# Patient Record
Sex: Male | Born: 1995 | Race: White | Hispanic: No | Marital: Single | Smoking: Never smoker
Health system: Southern US, Community
[De-identification: ages and names within clinical notes are randomized; demographics above are authoritative.]

## PROBLEM LIST (undated history)

## (undated) DIAGNOSIS — L309 Dermatitis, unspecified: Secondary | ICD-10-CM

## (undated) DIAGNOSIS — T7840XA Allergy, unspecified, initial encounter: Secondary | ICD-10-CM

## (undated) DIAGNOSIS — F909 Attention-deficit hyperactivity disorder, unspecified type: Secondary | ICD-10-CM

## (undated) HISTORY — DX: Attention-deficit hyperactivity disorder, unspecified type: F90.9

## (undated) HISTORY — DX: Dermatitis, unspecified: L30.9

## (undated) HISTORY — PX: NASAL FRACTURE SURGERY: SHX718

## (undated) HISTORY — PX: HYPOSPADIAS CORRECTION: SHX483

## (undated) HISTORY — DX: Allergy, unspecified, initial encounter: T78.40XA

---
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
## 2017-02-02 ENCOUNTER — Other Ambulatory Visit: Payer: Self-pay | Admitting: Family Medicine

## 2017-02-02 MED ORDER — LISDEXAMFETAMINE DIMESYLATE 50 MG PO CAPS: 50.0000 mg | ORAL_CAPSULE | ORAL | 0 refills | Status: DC

## 2017-02-02 NOTE — Telephone Encounter (Signed)
Pt request rx vyvanse. Call when ready for pick up. Last seen 09/11/16 for annual and last printed # 90 on 10/21/16.

## 2017-02-02 NOTE — Telephone Encounter (Signed)
Patient notified by telephone that script is up front ready for pickup. 

## 2017-02-02 NOTE — Telephone Encounter (Signed)
Printed.  Thanks.  

## 2017-02-02 NOTE — Telephone Encounter (Signed)
Copied from CRM [phone]. Topic: Quick Communication - See Telephone Encounter >> Feb 02, 2017 10:20 AM Diana EvesHoyt, Maryann B wrote: CRM for notification. See Telephone encounter for:  Refill on vyvanse  02/02/17.

## 2017-05-09 ENCOUNTER — Other Ambulatory Visit: Payer: Self-pay | Admitting: Family Medicine

## 2017-05-09 NOTE — Telephone Encounter (Signed)
Copied from CRM [phone]. Topic: Quick Communication - Rx Refill/Question >> May 09, 2017  1:13 PM Oneal GroutSebastian, Jennifer S wrote: Medication: lisdexamfetamine (VYVANSE) 50 MG capsule, 90 day supply  Has the patient contacted their pharmacy? Yes.   (Agent: If no, request that the patient contact the pharmacy for the refill.) Preferred Pharmacy (with phone number or street name): [facility] [address] Agent: Please be advised that RX refills may take up to 3 business days. We ask that you follow-up with your pharmacy.

## 2017-05-10 NOTE — Telephone Encounter (Signed)
Vyvanse refill request - controlled substance  LOV 08/16/16 with Dr. [facility]can  [facility]  [address]

## 2017-05-10 NOTE — Telephone Encounter (Signed)
[facility]ing [facility] [address] [place]; Last filled # 90 on 02/02/17 Last annual 08/16/16.Please advise.

## 2017-05-11 MED ORDER — LISDEXAMFETAMINE DIMESYLATE 50 MG PO CAPS: 50.0000 mg | ORAL_CAPSULE | ORAL | 0 refills | Status: DC

## 2017-05-11 NOTE — Telephone Encounter (Signed)
Sent.  Thanks. Needs f/u this summer.

## 2017-05-11 NOTE — Telephone Encounter (Signed)
Please schedule appointment as instructed. 

## 2017-05-13 NOTE — Telephone Encounter (Signed)
appt made with patient for Austin Kelly, RMA

## 2017-08-05 ENCOUNTER — Encounter: Payer: Self-pay | Admitting: Family Medicine

## 2017-08-05 ENCOUNTER — Ambulatory Visit: Payer: 59 | Admitting: Family Medicine

## 2017-08-05 VITALS — BP 132/74 | HR 97 | Temp 97.9°F | Ht 68.0 in | Wt 135.8 lb

## 2017-08-05 DIAGNOSIS — J309 Allergic rhinitis, unspecified: Secondary | ICD-10-CM

## 2017-08-05 DIAGNOSIS — Z7189 Other specified counseling: Secondary | ICD-10-CM

## 2017-08-05 DIAGNOSIS — F909 Attention-deficit hyperactivity disorder, unspecified type: Secondary | ICD-10-CM | POA: Diagnosis not present

## 2017-08-05 MED ORDER — GUANFACINE HCL ER 2 MG PO TB24: 2.0000 mg | ORAL_TABLET | Freq: Every day | ORAL | 3 refills | Status: DC

## 2017-08-05 MED ORDER — LISDEXAMFETAMINE DIMESYLATE 50 MG PO CAPS: 50.0000 mg | ORAL_CAPSULE | ORAL | 0 refills | Status: DC

## 2017-08-05 MED ORDER — DEXTROAMPHETAMINE SULFATE 5 MG PO TABS: 5.0000 mg | ORAL_TABLET | Freq: Every day | ORAL | 0 refills | Status: DC

## 2017-08-05 MED ORDER — BUDESONIDE 32 MCG/ACT NA SUSP: NASAL | 12 refills | Status: DC

## 2017-08-05 NOTE — Progress Notes (Signed)
Entering second year at [employer].  As and Bs at school.  He enjoyed Health visitorphysics and 3D computer modeling.  Home now and working this summer, taking some online classes.  He is registered for the fall.  Will live in dorm again.  Plan for mechanical engineering.    Parents designated if patient were incapacitated.  D/w pt.    ADD Compliant with meds: yes benefit from med (ie increase in concentration): yes  change in mood: no change in appetite: no Insomnia: no Tremor: no compliant with behavioral modification: yes.   ROS: Per HPI unless specifically indicated in ROS section   Meds, vitals, and allergies reviewed.   GEN: nad, alert and oriented, affect wnl and appropriate HEENT: mucous membranes moist NECK: supple w/o LA CV: rrr.  PULM: ctab, no inc wob ABD: soft, +bs EXT: no edema CN 2-12 wnl, s/s/dtr wnl x4.  No tremor.

## 2017-08-05 NOTE — Patient Instructions (Signed)
I would get a flu shot each fall.   Don't change your meds for now.  Update me as needed.  Take care.  Glad to see you.  

## 2017-08-07 ENCOUNTER — Other Ambulatory Visit: Payer: Self-pay | Admitting: Family Medicine

## 2017-08-07 DIAGNOSIS — Z7189 Other specified counseling: Secondary | ICD-10-CM | POA: Insufficient documentation

## 2017-08-07 NOTE — Assessment & Plan Note (Signed)
Not mentioned above, but doing well with meds, continue as is.  Allergy sx controlled w/o ADE on med.

## 2017-08-07 NOTE — Assessment & Plan Note (Addendum)
Continue as is, no ADE on meds.  Doing well, with relief from meds.  Update me as needed.  Routine cautions given re: use/potential misuse of meds.  He understood.

## 2017-08-07 NOTE — Assessment & Plan Note (Signed)
Parents designated if patient were incapacitated.   

## 2017-09-23 ENCOUNTER — Other Ambulatory Visit: Payer: Self-pay | Admitting: Family Medicine

## 2017-09-23 NOTE — Telephone Encounter (Signed)
Vyvanse 50 mg refill Last Refill:08/05/17 # 90 Last OV: 08/05/17 PCP: Para Marchuncan [id number] [place]

## 2017-09-23 NOTE — Telephone Encounter (Signed)
Patient's mother calling to check on this refill. Informed her that we are waiting on approval from Dr Para Marchuncan. States that it's the pharmacies fault because they only filled it for 30 days and then told them that they could fill the other 60 pills and went back and said they have to have Dr approval. Patient's mother upset and wanted PEC to call Dr Para Marchuncan on his personal number to get the medication. Informed her that I can not do that for this issue. Advised that since the request was not sent in until today, Dr Para Marchuncan has 48 to 72 hours to get it sent to the pharmacy.

## 2017-09-23 NOTE — Telephone Encounter (Signed)
Copied from CRM #[id number]. Topic: Quick Communication - Rx Refill/Question °>> Sep 23, 2017 12:26 PM [name], [name] wrote: °Medication: lisdexamfetamine (VYVANSE) 50 MG capsule [[id number]]  ° °Has the patient contacted their pharmacy? Yes Pharmacy only filled 30 day supply on 08/13/17 in need of new script. ° ° °Preferred Pharmacy (with phone number or street name): ° °Agent: Please be advised that RX refills may take up to 3 business days. We ask that you follow-up with your pharmacy. °

## 2017-09-23 NOTE — Telephone Encounter (Signed)
Name of Medication: Vyvanse 50 mg Name of [facility]acy: [facility] Last Fill or Written Date and Quantity: # 90 on 08/05/17 Last Office Visit and Type: 08/05/17 FU for Vyvanse Next Office Visit and Type: none Last Controlled Substance Agreement Date:07/29/16  Last UDS:07/30/16

## 2017-09-25 NOTE — Telephone Encounter (Signed)
This is the first I had heard of this situation.   Agree with needing time to safely address the refill.  We don't refill controlled meds after hours, so had I been called after 5PM I wouldn't have filled it per protocol.   In this case, I need verification from the pharmacy about the number filled on vyvanse, since rx was written for #90 on 08/05/17.  I need to know if they'll fill 30 vs 90 day supply (or if they will hold 3 postdated rxs, each for a 30 day supply) prior to addressing this rx.  Let me know and I'll work on it when possible.  Thanks.

## 2017-09-26 ENCOUNTER — Telehealth: Payer: Self-pay | Admitting: Family Medicine

## 2017-09-26 MED ORDER — LISDEXAMFETAMINE DIMESYLATE 50 MG PO CAPS: 50.0000 mg | ORAL_CAPSULE | Freq: Every day | ORAL | 0 refills | Status: DC

## 2017-09-26 NOTE — Telephone Encounter (Signed)
Copied from CRM [phone]#[id number]. Topic: Quick Communication - Rx Refill/Question >> Sep 23, 2017 12:26 PM Jens SomMedley, [name] wrote: Medication: lisdexamfetamine (VYVANSE) 50 MG capsule [[id number]][[id number]]   Has the patient contacted their pharmacy? Yes Pharmacy only filled 30 day supply on 08/13/17 in need of new script.   Preferred Pharmacy (with phone number or street name):  Agent: Please be advised that RX refills may take up to 3 business days. We ask that you follow-up with your pharmacy.

## 2017-09-26 NOTE — Telephone Encounter (Signed)
Please see 09/23/17 refill request.

## 2017-09-26 NOTE — Telephone Encounter (Signed)
Spoke with pharmacist who says #30 was filled on 08/05/17 and they cannot honor refills because it is a C2 Rx.    3 post-dated prescriptions can be sent electronically and held but no paper prescriptions can be held in the pharmacy.  The patient can bring the Rx in as needed.

## 2017-09-26 NOTE — Telephone Encounter (Signed)
rx sent x3. Thanks.

## 2017-10-24 ENCOUNTER — Telehealth: Payer: Self-pay

## 2017-10-24 ENCOUNTER — Telehealth: Payer: Self-pay | Admitting: Family Medicine

## 2017-10-24 MED ORDER — LISDEXAMFETAMINE DIMESYLATE 50 MG PO CAPS: 50.0000 mg | ORAL_CAPSULE | Freq: Every day | ORAL | 0 refills | Status: DC

## 2017-10-24 NOTE — Telephone Encounter (Signed)
Sent. Thanks.  Please d/c the local rxs that remain.

## 2017-10-24 NOTE — Telephone Encounter (Signed)
According to med list pt has Vyvanse 50 mg # 30 x 2 on 09/26/17.Please advise.

## 2017-10-24 NOTE — Telephone Encounter (Signed)
Copied from CRM [id number]. Topic: Quick Communication - See Telephone Encounter >> Oct 24, 2017  4:58 PM Floria Raveling A wrote: CRM for notification. See Telephone encounter for: 10/24/17. Pt called in and stated he is no in [place] and he is not able to pick up this med here. lisdexamfetamine (VYVANSE) 50 MG capsule [[id number]].  He would like to know if it can be sent to a pharmacy in [place]?    [facility] [address]  [phone]  Pt best number  - [phone]

## 2017-10-24 NOTE — Telephone Encounter (Signed)
Patient seen at [facility]

## 2017-10-24 NOTE — Telephone Encounter (Signed)
PLEASE NOTE: All timestamps contained within this report are represented as [place] Standard Time. CONFIDENTIALTY NOTICE: This fax transmission is intended only for the addressee. It contains information that is legally privileged, confidential or otherwise protected from use or disclosure. If you are not the intended recipient, you are strictly prohibited from reviewing, disclosing, copying using or disseminating any of this information or taking any action in reliance on or regarding this information. If you have received this fax in error, please notify us immediately by telephone so that we can arrange for its return to [place]. Phone: [phone], Toll-Free: [phone], Fax: [phone] Page: 1 of 2 Call Id: [id number] [facility] Night - Client TELEPHONE ADVICE RECORD [facility] Patient Name: Austin Kelly Gender: Unknown DOB: 1995/03/23 Age: 22 Y 8 M 21 D Return Phone Number: [phone] (Primary) Address: City/State/Zip: [place] [address] Client [facility] Night - Client Client Site [facility] - Night Physician Raechel Ache - MD Contact Type Call Who Is Calling Patient / Member / Family / Caregiver Call Type Triage / Clinical Relationship To Patient Self Return Phone Number [phone] (Primary) Chief Complaint Prescription Refill or Medication Request (non symptomatic) Reason for Call Medication Question / Request Initial Comment Caller had a 90 day prescription which became a 30 day with two refills. He is at college now and he is unable to fill it. Translation No Nurse Assessment Nurse: Hart Rochester, RN, Darl Pikes Date/Time [name] Time): 10/23/2017 12:48:38 PM Please select the assessment type ---Request for controlled medication refill Additional Documentation ---Caller requests 90 day fill for Vyvanse since he is at college Is there an on-call physician for the client? ---Yes Do the  client directives specifically allow for paging the on-call regarding scheduled drugs? ---No Additional Documentation ---Caller instructed to call the office in the morning for questions regarding his refill. Verbalized understanding. Nurse: Hart Rochester, RN, Darl Pikes Date/Time [name] Time): 10/23/2017 12:46:03 PM Confirm and document reason for call. If symptomatic, describe symptoms. ---Caller reports that he was supposed to have a 90 day prescription refill, but ended up with a 30 day prescription with 2 refills. Prescription is for Vyvanse 10 mg. Is not out of medication. Denies assessment. [facility] [address] [place] [phone] Does the patient have any new or worsening symptoms? ---No Guidelines Guideline Title Affirmed Question Affirmed Notes Nurse Date/Time (Eastern Time) Disp. Time [name] Time) Disposition Final User 10/23/2017 12:49:48 PM Clinical Call Yes Hart Rochester, RN, Estill Bamberg NOTE: All timestamps contained within this report are represented as [place] Standard Time. CONFIDENTIALTY NOTICE: This fax transmission is intended only for the addressee. It contains information that is legally privileged, confidential or otherwise protected from use or disclosure. If you are not the intended recipient, you are strictly prohibited from reviewing, disclosing, copying using or disseminating any of this information or taking any action in reliance on or regarding this information. If you have received this fax in error, please notify us immediately by telephone so that we can arrange for its return to [place]. Phone: [phone], Toll-Free: [phone], Fax: [phone] Page: 2 of 2 Call Id: [id number]

## 2017-10-24 NOTE — Telephone Encounter (Signed)
It was prev sent to the pharmacy locally.  Why can't he fill it?   If this is an issue re: pharmacy location, then he'll need to plan ahead about where to send med for refills.   If he has valid rx locally (and he should), then I whould use that for now.

## 2017-10-24 NOTE — Telephone Encounter (Signed)
Patient says he is at [facility] and cannot get here to pick up the refill and the pharmacy will not transfer the Rx.  Patient [facility] that [facility] [place]  [phone].

## 2017-10-25 NOTE — Telephone Encounter (Signed)
Pharmacy contacted to DC any remaining refills of Vyvanse.

## 2017-10-25 NOTE — Telephone Encounter (Signed)
Rx was sent to [facility] in [place] as requested.

## 2017-10-25 NOTE — Telephone Encounter (Signed)
See phone note 10/24/17.

## 2017-10-30 DIAGNOSIS — Z23 Encounter for immunization: Secondary | ICD-10-CM | POA: Diagnosis not present

## 2017-11-24 ENCOUNTER — Other Ambulatory Visit: Payer: Self-pay | Admitting: Family Medicine

## 2017-11-24 NOTE — Telephone Encounter (Signed)
Name of Medication: Vyvanse 50 mg Name of Pharmacy[facility] Last [facility] or Written Date and Quantity: # 30 on 10/24/17 Last Office Visit and Type: 08/05/17 for ADHD Next Office Visit and Type: none scheduled Last Controlled Substance Agreement Date: 07/29/16 Last UDS:07/30/16.

## 2017-11-25 NOTE — Telephone Encounter (Signed)
Pt mother called to request Vyvanse today - advised that no one is at the office. She states that pt runs out 11/27/17. She was upset bc mychart msg was sent 10/10 and hasn't even been viewed. Advised her of 3 business days to process. She said it used to be 24 hrs, then changed to 48 hrs. She noted this is not the first time pt has run out.   Scheduled appt for 02/07/18 at 12:00pm as pt will be home from college then. Can 3 months of Vyvanse be sent in to   [id number], [place] - [address] [phone] (Phone) [phone] (Fax)   Ok to respond to pt on his cell # or via mychart.

## 2017-11-27 MED ORDER — LISDEXAMFETAMINE DIMESYLATE 50 MG PO CAPS: 50.0000 mg | ORAL_CAPSULE | Freq: Every day | ORAL | 0 refills | Status: DC

## 2017-11-27 NOTE — Telephone Encounter (Signed)
The patient is above 20 years old so the communication needs to be directed back to him.  He needs to manage his supply of medication.  If he is taking it every day then he will know when he will run out and he needs to give [place] adequate time for refill requests.  We have previously compensated for his situation at college by sending the prescription to a second pharmacy.  We will continue to refill prescriptions as quickly as possible, within our protocol.  This isn't a delayed response.  His message had been reviewed but a nonemergent refill request like this can be filled within 3 business days.  rx sent for 30 days x3 rx.  He needs to check with the pharmacy to see if they'll hold/fill the 2nd and 3rd rx.  If he finds any of this unacceptable, then he needs to find a different clinic.  Either way I wish him the best.

## 2017-11-28 NOTE — Telephone Encounter (Signed)
Left detailed message on voicemail of patient. 

## 2017-11-29 ENCOUNTER — Telehealth: Payer: Self-pay | Admitting: *Deleted

## 2017-11-29 NOTE — Telephone Encounter (Signed)
PA submitted thru [facility] for Vyvanse, awaiting response.

## 2018-01-26 ENCOUNTER — Other Ambulatory Visit: Payer: Self-pay | Admitting: Family Medicine

## 2018-01-27 NOTE — Telephone Encounter (Signed)
Electronic refill request. Vyvanse Last office visit:   08/05/17 Last Filled:    30 capsule 0 11/27/2017  Please advise.

## 2018-01-29 NOTE — Telephone Encounter (Signed)
According to EMR, this was already send to [id number] [place] [place] - [address] back on 11/27/17 to fill on/after 01/26/18.  He needs to contact the pharmacy.

## 2018-01-30 ENCOUNTER — Encounter: Payer: Self-pay | Admitting: *Deleted

## 2018-01-30 NOTE — Telephone Encounter (Signed)
Notification sent through MyChart to patient.

## 2018-02-07 ENCOUNTER — Encounter: Payer: Self-pay | Admitting: Family Medicine

## 2018-02-07 ENCOUNTER — Ambulatory Visit (INDEPENDENT_AMBULATORY_CARE_PROVIDER_SITE_OTHER): Payer: 59 | Admitting: Family Medicine

## 2018-02-07 DIAGNOSIS — F909 Attention-deficit hyperactivity disorder, unspecified type: Secondary | ICD-10-CM

## 2018-02-07 MED ORDER — DEXTROAMPHETAMINE SULFATE 5 MG PO TABS: 5.0000 mg | ORAL_TABLET | Freq: Every day | ORAL | 0 refills | Status: DC

## 2018-02-07 MED ORDER — LISDEXAMFETAMINE DIMESYLATE 50 MG PO CAPS: 50.0000 mg | ORAL_CAPSULE | Freq: Every day | ORAL | 0 refills | Status: DC

## 2018-02-07 MED ORDER — LORATADINE 10 MG PO TABS: 10.0000 mg | ORAL_TABLET | Freq: Every day | ORAL | Status: DC | PRN

## 2018-02-07 MED ORDER — GUANFACINE HCL ER 2 MG PO TB24: 2.0000 mg | ORAL_TABLET | Freq: Every day | ORAL | 3 refills | Status: DC

## 2018-02-07 MED ORDER — BUDESONIDE 32 MCG/ACT NA SUSP: NASAL | 12 refills | Status: DC

## 2018-02-07 NOTE — Assessment & Plan Note (Signed)
Doing well.   We talked about refills.  He is going to get all of his meds at [facility].  I asked for adequate lead time to process refill requests.   rxs sent today.  Okay for outpatient f/u.  D/w pt about med use, routine cautions, exercise, etc.  Update me as needed.  He had a flu shot out of clinic.

## 2018-02-07 NOTE — Progress Notes (Signed)
ADD f/u.  As and Bs at [employer] this fall.  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    Will be taking racquetball, thermodynamics, differential math in the spring.    Living on campus.  Good roommates.  No ADE on meds.  No tremor, no insomnia.  Med still help.  He can tell a difference when he forgets to take med, ie he notes relief on med.    He is active on campus.  He is applying for internships.    We talked about refills.  He is going to get all of his meds at [facility].  I asked for adequate lead time to process refill requests.    Meds, vitals, and allergies reviewed.   ROS: Per HPI unless specifically indicated in ROS section   GEN: nad, alert and oriented HEENT: mucous membranes moist NECK: supple w/o LA CV: rrr PULM: ctab, no inc wob ABD: soft, +bs EXT: no edema SKIN: no acute rash CN 2-12 wnl B, S/S/DTR wnl x4

## 2018-02-07 NOTE — Patient Instructions (Signed)
Don't change your meds for now.  Take care.  Glad to see you.  Update me as needed.  Good luck with school.

## 2018-02-09 ENCOUNTER — Telehealth: Payer: Self-pay

## 2018-02-09 MED ORDER — DEXTROAMPHETAMINE SULFATE 5 MG PO TABS: 5.0000 mg | ORAL_TABLET | Freq: Every day | ORAL | 0 refills | Status: DC

## 2018-02-09 NOTE — Telephone Encounter (Signed)
Spoke with pt notifying him [facility]ill [facility].  Pt verbalizes understanding and expresses his thanks.

## 2018-02-09 NOTE — Telephone Encounter (Signed)
Sent in.plz notify pt.  

## 2018-02-09 NOTE — Telephone Encounter (Signed)
Austin Kelly with [facility] said they cannot get the dextroamphetamine 5 mg and they are dc the rx and pt request to send dextroamphetamine 5 mg to [facility]. Dextroamphetamine 5 mg # 90 with instructions take one tab po daily sent on 02/07/18. Dr Para Marchuncan is out of office.Please advise.

## 2018-05-29 ENCOUNTER — Other Ambulatory Visit: Payer: Self-pay | Admitting: Family Medicine

## 2018-05-29 NOTE — Telephone Encounter (Signed)
Name of Medication: lisdexamfetamine (VYVANSE) 50 MG capsule  Name of Pharmacy: [facility]  Last Fill or Written Date and Quantity: 02/07/2018 #30  Last Office Visit and Type: 02/07/2018 follow up  Next Office Visit and Type: 6/61/2020 CPE  Last Controlled Substance Agreement Date: 07/29/2016  Last UDS:07/30/2016

## 2018-05-30 MED ORDER — LISDEXAMFETAMINE DIMESYLATE 50 MG PO CAPS: 50.0000 mg | ORAL_CAPSULE | Freq: Every day | ORAL | 0 refills | Status: DC

## 2018-05-30 NOTE — Telephone Encounter (Signed)
Sent. Thanks.   

## 2018-08-01 ENCOUNTER — Encounter: Payer: Self-pay | Admitting: Family Medicine

## 2018-08-01 ENCOUNTER — Other Ambulatory Visit: Payer: Self-pay

## 2018-08-01 ENCOUNTER — Ambulatory Visit (INDEPENDENT_AMBULATORY_CARE_PROVIDER_SITE_OTHER): Payer: 59 | Admitting: Family Medicine

## 2018-08-01 VITALS — BP 138/80 | HR 91 | Temp 98.1°F | Ht 68.0 in | Wt 135.2 lb

## 2018-08-01 DIAGNOSIS — J309 Allergic rhinitis, unspecified: Secondary | ICD-10-CM

## 2018-08-01 DIAGNOSIS — F909 Attention-deficit hyperactivity disorder, unspecified type: Secondary | ICD-10-CM

## 2018-08-01 DIAGNOSIS — Z7189 Other specified counseling: Secondary | ICD-10-CM

## 2018-08-01 DIAGNOSIS — Z0000 Encounter for general adult medical examination without abnormal findings: Secondary | ICD-10-CM

## 2018-08-01 MED ORDER — LISDEXAMFETAMINE DIMESYLATE 50 MG PO CAPS: 50.0000 mg | ORAL_CAPSULE | Freq: Every day | ORAL | 0 refills | Status: DC

## 2018-08-01 MED ORDER — DEXTROAMPHETAMINE SULFATE 5 MG PO TABS: 5.0000 mg | ORAL_TABLET | Freq: Every day | ORAL | 0 refills | Status: DC

## 2018-08-01 NOTE — Patient Instructions (Signed)
Good luck with school.  Update me as needed.  Flu shot in the fall.  Last meningitis shot today.  Take care.  Glad to see you.

## 2018-08-01 NOTE — Progress Notes (Signed)
CPE- See plan.  Routine anticipatory guidance given to patient.  See health maintenance.  The possibility exists that previously documented standard health maintenance information may have been brought forward from a previous encounter into this note.  If needed, that same information has been updated to reflect the current situation based on today's encounter.    Pandemic considerations d/w pt, esp re: school.  He'll likely have some online vs in person classes. He'll be in an apartment.  He is taking one online this summer, will start that soon.    Tetanus 2011 Flu due fall.   PNA and shingles not due.  Colon and prostate cancer screening not due.  Living will d/w pt.  Parents designated if patient were incapacitated. Diet and exercise d/w pt.  Doing well with both.    He has some nosebleeds recently.  Not regular use of rhinocort to cause that.  He had more allergies this years, with less allergy med use, the combination of which may have contributed.  Discussed with patient.  He will update me as needed.  ADD Compliant with meds:  Yes, used daily.   benefit from med (ie increase in concentration): yes, he can tell when he has a missed dose.  change in mood:no change in appetite:no Insomnia:no tremor:no compliant with behavioral modification: yes  PMH and SH reviewed  Meds, vitals, and allergies reviewed.   ROS: Per HPI.  Unless specifically indicated otherwise in HPI, the patient denies:  General: fever. Eyes: acute vision changes ENT: sore throat Cardiovascular: chest pain Respiratory: SOB GI: vomiting GU: dysuria Musculoskeletal: acute back pain Derm: acute rash Neuro: acute motor dysfunction Psych: worsening mood Endocrine: polydipsia Heme: bleeding Allergy: hayfever  GEN: nad, alert and oriented HEENT: ncat NECK: supple w/o LA CV: rrr. 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  This is an old finding.  He can update me if he notices changes.  He had not noted any changes. No tremor

## 2018-08-02 ENCOUNTER — Telehealth: Payer: Self-pay | Admitting: Family Medicine

## 2018-08-02 NOTE — Assessment & Plan Note (Signed)
Parents designated if patient were incapacitated.   

## 2018-08-02 NOTE — Assessment & Plan Note (Signed)
Benefits from medication.  No adverse effect.  Continue as is.  Doing well in school.  He will update me as needed.  Routine cautions given.  He agrees.

## 2018-08-02 NOTE — Assessment & Plan Note (Signed)
Tetanus 2011 Flu due fall.   PNA and shingles not due.  Colon and prostate cancer screening not due.  Living will d/w pt.  Parents designated if patient were incapacitated. Diet and exercise d/w pt.  Doing well with both.

## 2018-08-02 NOTE — Assessment & Plan Note (Signed)
He has some nosebleeds recently.  Not regular use of rhinocort to cause that.  He had more allergies this years, with less allergy med use, the combination of which may have contributed.  Discussed with patient.  He will update me as needed.

## 2018-08-02 NOTE — Telephone Encounter (Signed)
We did not have Bexsero vaccine at patient's recent office visit.  I think we have it in stock now.  Please verify that we have it in stock and set up a nurse visit for this to be done.  Please document this onto the physical or in a way that he does not get an extra charge, since it would have normally been done at the physical.  I told him we were out of the vaccine at the visit and we would get in touch with him.  Thanks.

## 2018-08-22 NOTE — Telephone Encounter (Signed)
Pt needed first available appt he is scheduled for 09/05/18 @ 8:30am

## 2018-08-30 ENCOUNTER — Encounter: Payer: Self-pay | Admitting: Family Medicine

## 2018-08-31 ENCOUNTER — Other Ambulatory Visit: Payer: Self-pay | Admitting: *Deleted

## 2018-08-31 NOTE — Telephone Encounter (Signed)
Patient states on a MyChart message that [facility] cannot [facility] since his insurance changed and that we would be receiving a request from [facility] and possibly a PA, so far, no PA.

## 2018-09-01 MED ORDER — DEXTROAMPHETAMINE SULFATE 5 MG PO TABS: 5.0000 mg | ORAL_TABLET | Freq: Every day | ORAL | 0 refills | Status: DC

## 2018-09-01 NOTE — Telephone Encounter (Signed)
I sent this in the meantime.  Thanks.  I will await the PA, if needed.

## 2018-09-05 ENCOUNTER — Encounter: Payer: Self-pay | Admitting: Family Medicine

## 2018-09-05 ENCOUNTER — Other Ambulatory Visit: Payer: Self-pay

## 2018-09-05 ENCOUNTER — Ambulatory Visit (INDEPENDENT_AMBULATORY_CARE_PROVIDER_SITE_OTHER): Payer: 59 | Admitting: *Deleted

## 2018-09-05 DIAGNOSIS — Z23 Encounter for immunization: Secondary | ICD-10-CM

## 2018-09-05 NOTE — Telephone Encounter (Signed)
No PA was received from the insurance company but patient came in for a nurse visit for an injection today and asked if the PA had been completed.  PA done thru [facility], awaiting response.

## 2018-10-30 ENCOUNTER — Encounter: Payer: Self-pay | Admitting: Family Medicine

## 2018-11-01 ENCOUNTER — Telehealth: Payer: Self-pay | Admitting: Family Medicine

## 2018-11-01 NOTE — Telephone Encounter (Signed)
Please call pt.  See message below.  See if we can get a phone follow up arranged if he can't come in.  Thanks.   Dr. Damita Dunnings,   I have one refill remaining for my Vyvanse 50mg  prescription remaining. I have about a week of that medication remaining. That prescription was sent to [facility] (([phone]). I would like to discuss possily increasing my dose to 60mg . I feel that the 50mg  dose is not as effective as it used to be. I dont know if this would simply involve you rewriting that prescription to [place], or if I would need to conduct a visit to discuss this.   Thank you, Austin Kelly

## 2018-11-02 NOTE — Telephone Encounter (Signed)
Pt called about changing dosage of vyvanse and pt scheduled in office appt with Dr Damita Dunnings on 11/03/18 at 9:30.Pt has no covid symptoms, no travel and no known exposure to + covid.

## 2018-11-03 ENCOUNTER — Ambulatory Visit (INDEPENDENT_AMBULATORY_CARE_PROVIDER_SITE_OTHER): Payer: 59 | Admitting: Family Medicine

## 2018-11-03 ENCOUNTER — Encounter: Payer: Self-pay | Admitting: Family Medicine

## 2018-11-03 ENCOUNTER — Other Ambulatory Visit: Payer: Self-pay

## 2018-11-03 VITALS — BP 148/72 | HR 109 | Temp 98.3°F | Ht 68.0 in | Wt 141.2 lb

## 2018-11-03 DIAGNOSIS — Z23 Encounter for immunization: Secondary | ICD-10-CM | POA: Diagnosis not present

## 2018-11-03 DIAGNOSIS — F909 Attention-deficit hyperactivity disorder, unspecified type: Secondary | ICD-10-CM | POA: Diagnosis not present

## 2018-11-03 NOTE — Telephone Encounter (Signed)
Noted. Thanks.

## 2018-11-03 NOTE — Patient Instructions (Addendum)
Check your BP and pulse over the weekend at rest and update me Monday.  We'll go from there.  I need to see those readings prior to med changes.   Take care.  Glad to see you.

## 2018-11-03 NOTE — Progress Notes (Signed)
He is still enrolled at [employer], was living on campus, had to move home due to [place] pandemic.  All of his classes are online.  He is trying to adjust to the changes.  He is living at home.  He is away from his friends.    He had benefit from med prev with concentration.   He had a lot of coffee just prior to OV here today.   He typically does not have elevated blood pressure or tachycardia.  No chest pain.  Not short of breath. Flu shot done at office visit  Meds, vitals, and allergies reviewed.   ROS: Per HPI unless specifically indicated in ROS section   GEN: nad, alert and oriented HEENT: ncat NECK: supple w/o LA CV: Regular but tacky. no murmur PULM: ctab, no inc wob ABD: soft, +bs EXT: no edema  SKIN: no acute rash

## 2018-11-04 ENCOUNTER — Encounter: Payer: Self-pay | Admitting: Family Medicine

## 2018-11-05 NOTE — Assessment & Plan Note (Signed)
I want him to recheck his blood pressure and pulse out of clinic.  If those normalize then we can consider increasing his Vyvanse.  He has been on a stable dose for a long period of time and he has a significant disruption in school that likely makes it harder for him to complete his task, with a transition to online learning.  Discussed with patient.  He agrees.  He will update me.

## 2018-11-06 ENCOUNTER — Telehealth: Payer: Self-pay

## 2018-11-06 MED ORDER — LISDEXAMFETAMINE DIMESYLATE 60 MG PO CAPS: 60.0000 mg | ORAL_CAPSULE | Freq: Every day | ORAL | 0 refills | Status: DC

## 2018-11-06 NOTE — Telephone Encounter (Signed)
Pt left v/m; pt seen 11/03/18 and pt sent pt message on 11/04/18; pt request BP readings be reviewed in pt message and an increase in Vyvanse dosage;prefers o have rx sent to pharmacy today due to pt being out of med.pt request cb.

## 2018-11-06 NOTE — Telephone Encounter (Signed)
pts mom had left a ;v/m requesting cb with status of vyvanse rx. I called and spoke with pt and he saw the mychart note from Dr Damita Dunnings and nothing further needed.

## 2018-11-06 NOTE — Telephone Encounter (Signed)
I sent the refill with the dose adjusted.  I sent him a Pharmacist, community message.  Thanks.

## 2018-11-20 ENCOUNTER — Encounter: Payer: Self-pay | Admitting: Family Medicine

## 2018-11-21 ENCOUNTER — Other Ambulatory Visit: Payer: Self-pay | Admitting: Family Medicine

## 2018-11-21 MED ORDER — LISDEXAMFETAMINE DIMESYLATE 60 MG PO CAPS: 60.0000 mg | ORAL_CAPSULE | ORAL | 0 refills | Status: DC

## 2018-11-21 MED ORDER — LISDEXAMFETAMINE DIMESYLATE 60 MG PO CAPS: 60.0000 mg | ORAL_CAPSULE | Freq: Every day | ORAL | 0 refills | Status: DC

## 2019-01-09 ENCOUNTER — Other Ambulatory Visit: Payer: Self-pay

## 2019-01-09 NOTE — Telephone Encounter (Signed)
I received a refill request for patient's Dextroamphetamine. Patient was last seen on 11/03/18. This medication was last refilled 09/01/18 for #90 with 0 refills. Patient has no upcoming appts. Is this ok to refill?   Pt requests [facility] delivery pharmacy. Thanks!

## 2019-01-10 MED ORDER — DEXTROAMPHETAMINE SULFATE 5 MG PO TABS: 5.0000 mg | ORAL_TABLET | Freq: Every day | ORAL | 0 refills | Status: DC

## 2019-01-10 NOTE — Telephone Encounter (Signed)
Sent. Thanks.   

## 2019-02-17 ENCOUNTER — Encounter: Payer: Self-pay | Admitting: Family Medicine

## 2019-02-19 ENCOUNTER — Other Ambulatory Visit: Payer: Self-pay | Admitting: Family Medicine

## 2019-02-19 NOTE — Telephone Encounter (Signed)
Electronic refill request. Guanfacine Last office visit:   11/03/2018 Last Filled:    90 tablet 3 02/07/2018   Please advise.

## 2019-02-20 NOTE — Telephone Encounter (Signed)
See which pharmacy he wants to use with this and let me know. Thanks.

## 2019-02-20 NOTE — Telephone Encounter (Signed)
Patient doesn't go back to school until 03/03/2019 so send to [facility].

## 2019-02-20 NOTE — Telephone Encounter (Signed)
Sent. Thanks.   

## 2019-02-21 ENCOUNTER — Other Ambulatory Visit: Payer: Self-pay | Admitting: Family Medicine

## 2019-02-21 MED ORDER — LISDEXAMFETAMINE DIMESYLATE 60 MG PO CAPS: 60.0000 mg | ORAL_CAPSULE | Freq: Every day | ORAL | 0 refills | Status: DC

## 2019-02-21 MED ORDER — LISDEXAMFETAMINE DIMESYLATE 60 MG PO CAPS: 60.0000 mg | ORAL_CAPSULE | ORAL | 0 refills | Status: DC

## 2019-02-27 ENCOUNTER — Ambulatory Visit: Payer: 59 | Attending: Internal Medicine

## 2019-02-27 DIAGNOSIS — Z20822 Contact with and (suspected) exposure to covid-19: Secondary | ICD-10-CM

## 2019-03-01 LAB — NOVEL CORONAVIRUS, NAA: SARS-CoV-2, NAA: NOT DETECTED

## 2019-03-06 ENCOUNTER — Telehealth: Payer: Self-pay | Admitting: Family Medicine

## 2019-03-06 NOTE — Telephone Encounter (Signed)
Patient called today  He spoke with the pharmacy and they stated he will need a prior Authorization done for his  VYVANSE 60 mg   [facility] [address] [place]

## 2019-03-07 NOTE — Telephone Encounter (Signed)
PA approved for dates: 03/07/19-03/06/20. Pharmacy advised through fax. Patient advised.

## 2019-03-07 NOTE — Telephone Encounter (Signed)
Working on [place] for this, submitted through covermymeds. Awaiting response from insurance

## 2019-06-19 ENCOUNTER — Other Ambulatory Visit: Payer: Self-pay | Admitting: Family Medicine

## 2019-06-19 MED ORDER — LISDEXAMFETAMINE DIMESYLATE 60 MG PO CAPS: 60.0000 mg | ORAL_CAPSULE | ORAL | 0 refills | Status: DC

## 2019-06-19 MED ORDER — LISDEXAMFETAMINE DIMESYLATE 60 MG PO CAPS: 60.0000 mg | ORAL_CAPSULE | Freq: Every day | ORAL | 0 refills | Status: DC

## 2019-06-19 NOTE — Telephone Encounter (Signed)
Sent. Thanks.   

## 2019-06-19 NOTE — Telephone Encounter (Signed)
Name of Medication: Vyvanse [facility] or Written Date and Quantity: 02/21/19 x 3 RXs Last Office Visit and Type: 11/03/2018 to discuss Vyvanse. Next Office Visit and Type: none Last Controlled Substance Agreement Date: 07/30/16 Last UDS: 07/30/16

## 2019-07-19 ENCOUNTER — Telehealth: Payer: Self-pay | Admitting: Family Medicine

## 2019-07-19 NOTE — Telephone Encounter (Signed)
Patient's mother,Sandra,called. Patient scheduled appointment for a physical on 09/24/19.  Patient's going to be working in [place] for the summer.  Patient will run out of Vyvanse on 09/18/19. Dois Davenport wanted to make sure the medication can be refilled before patient's appointment on 09/24/19.  If it can be refilled before appointment, can the rx be sent to [place].Please call Dois Davenport back.

## 2019-07-20 NOTE — Telephone Encounter (Signed)
Mom Dois Davenport) advised.

## 2019-07-20 NOTE — Telephone Encounter (Signed)
We can take care of this.  Have him update [place] 1 week before the refill as needed and give [place] the desired pharmacy address at that time.  Thanks.

## 2019-08-22 ENCOUNTER — Other Ambulatory Visit: Payer: Self-pay | Admitting: Family Medicine

## 2019-08-27 NOTE — Telephone Encounter (Signed)
Refill request Dextrostat Last refill 01/10/19 #90 Last office visit 11/03/18 Upcoming appointment 09/24/18

## 2019-08-28 MED ORDER — DEXTROAMPHETAMINE SULFATE 5 MG PO TABS: 5.0000 mg | ORAL_TABLET | Freq: Every day | ORAL | 0 refills | Status: DC

## 2019-08-28 NOTE — Telephone Encounter (Signed)
Sent. Thanks.   

## 2019-09-20 ENCOUNTER — Encounter: Payer: Self-pay | Admitting: Family Medicine

## 2019-09-20 NOTE — Telephone Encounter (Signed)
Last office visit 11/03/2018 for Discuss Vyvanse.  Last refilled 06/19/2019 for #30 with no refills x 3 months.  Next Appt: 09/27/2019 for CPE.

## 2019-09-21 MED ORDER — LISDEXAMFETAMINE DIMESYLATE 60 MG PO CAPS: 60.0000 mg | ORAL_CAPSULE | ORAL | 0 refills | Status: DC

## 2019-09-21 NOTE — Telephone Encounter (Signed)
Sent. Thanks.   

## 2019-09-24 ENCOUNTER — Encounter: Payer: 59 | Admitting: Family Medicine

## 2019-09-27 ENCOUNTER — Encounter: Payer: Self-pay | Admitting: Family Medicine

## 2019-09-27 ENCOUNTER — Other Ambulatory Visit: Payer: Self-pay

## 2019-09-27 ENCOUNTER — Ambulatory Visit (INDEPENDENT_AMBULATORY_CARE_PROVIDER_SITE_OTHER): Payer: 59 | Admitting: Family Medicine

## 2019-09-27 VITALS — BP 138/82 | HR 76 | Temp 97.0°F | Ht 69.5 in | Wt 140.1 lb

## 2019-09-27 DIAGNOSIS — F909 Attention-deficit hyperactivity disorder, unspecified type: Secondary | ICD-10-CM

## 2019-09-27 DIAGNOSIS — Z0000 Encounter for general adult medical examination without abnormal findings: Secondary | ICD-10-CM

## 2019-09-27 DIAGNOSIS — Z23 Encounter for immunization: Secondary | ICD-10-CM

## 2019-09-27 DIAGNOSIS — Z7189 Other specified counseling: Secondary | ICD-10-CM

## 2019-09-27 MED ORDER — LISDEXAMFETAMINE DIMESYLATE 60 MG PO CAPS: 60.0000 mg | ORAL_CAPSULE | Freq: Every day | ORAL | 0 refills | Status: DC

## 2019-09-27 MED ORDER — LISDEXAMFETAMINE DIMESYLATE 60 MG PO CAPS: 60.0000 mg | ORAL_CAPSULE | ORAL | 0 refills | Status: DC

## 2019-09-27 NOTE — Progress Notes (Signed)
This visit occurred during the SARS-CoV-2 public health emergency.  Safety protocols were in place, including screening questions prior to the visit, additional usage of staff PPE, and extensive cleaning of exam room while observing appropriate contact time as indicated for disinfecting solutions.  Tetanus 2021 Flu due fall.   covid vaccine 2021 PNA and shingles not due.  Colon and prostate cancer screening not due.  Living will d/w pt.  Parents designated if patient were incapacitated. Diet and exercise d/w pt.  Doing well with both.    Starting senior year at [employer] this fall.    ADD Compliant with meds: yes benefit from med (ie increase in concentration): yes, he can tell when he is off med change in mood: no change in appetite:no Insomnia: no tremor:no compliant with behavioral modification: yes, making lists, etc.    [facility] [address] [place] [facility]    ROS: Per HPI unless specifically indicated in ROS section  Meds, vitals, and allergies reviewed.   GEN: nad, alert and oriented, affect wnl and appropriate HEENT: ncat NECK: supple w/o LA CV: rrr.  PULM: ctab, no inc wob ABD: soft, +bs EXT: no edema No tremor.

## 2019-09-27 NOTE — Patient Instructions (Signed)
Don't change your meds for now.   Take care.  Glad to see you. Update me as needed.   

## 2019-10-01 NOTE — Assessment & Plan Note (Signed)
Tetanus 2021 Flu due fall.   covid vaccine 2021 PNA and shingles not due.  Colon and prostate cancer screening not due.  Living will d/w pt.  Parents designated if patient were incapacitated. Diet and exercise d/w pt.  Doing well with both.

## 2019-10-01 NOTE — Assessment & Plan Note (Signed)
Controlled with meds.  Routine cautions given to patient.  No change in meds at this point.  He agrees with plan.

## 2019-10-01 NOTE — Assessment & Plan Note (Signed)
Living will d/w pt.  Parents designated if patient were incapacitated.  °

## 2019-12-25 ENCOUNTER — Other Ambulatory Visit: Payer: Self-pay | Admitting: Family Medicine

## 2019-12-25 NOTE — Telephone Encounter (Signed)
Last office visit 09/27/2019. No upcoming appt Last refill written 09/27/2019 (fill on/after 11/20/2019)

## 2019-12-26 MED ORDER — LISDEXAMFETAMINE DIMESYLATE 60 MG PO CAPS: 60.0000 mg | ORAL_CAPSULE | ORAL | 0 refills | Status: DC

## 2019-12-26 MED ORDER — LISDEXAMFETAMINE DIMESYLATE 60 MG PO CAPS
60.0000 mg | ORAL_CAPSULE | Freq: Every day | ORAL | 0 refills | Status: DC
Start: 2019-12-26 — End: 2020-03-07

## 2019-12-26 NOTE — Telephone Encounter (Signed)
Sent. Thanks.   

## 2020-02-19 ENCOUNTER — Other Ambulatory Visit: Payer: Self-pay | Admitting: Family Medicine

## 2020-02-22 ENCOUNTER — Other Ambulatory Visit: Payer: Self-pay | Admitting: Family Medicine

## 2020-03-06 ENCOUNTER — Other Ambulatory Visit: Payer: Self-pay | Admitting: Family Medicine

## 2020-03-06 NOTE — Telephone Encounter (Signed)
Refill request for Vyvanse 60 mg caps.  LOV - 09/27/19 Next OV - not scheduled Last refill sent 12/26/19 #30/0 to fill on or after 02/24/20

## 2020-03-07 MED ORDER — LISDEXAMFETAMINE DIMESYLATE 60 MG PO CAPS
60.0000 mg | ORAL_CAPSULE | ORAL | 0 refills | Status: DC
Start: 2020-03-07 — End: 2020-07-21

## 2020-03-07 MED ORDER — LISDEXAMFETAMINE DIMESYLATE 60 MG PO CAPS
60.0000 mg | ORAL_CAPSULE | ORAL | 0 refills | Status: DC
Start: 2020-03-07 — End: 2020-07-23

## 2020-03-07 MED ORDER — LISDEXAMFETAMINE DIMESYLATE 60 MG PO CAPS
60.0000 mg | ORAL_CAPSULE | Freq: Every day | ORAL | 0 refills | Status: DC
Start: 2020-03-07 — End: 2020-07-23

## 2020-03-07 NOTE — Telephone Encounter (Signed)
Prescription sent for his next round of refills.  Thanks.

## 2020-04-12 ENCOUNTER — Other Ambulatory Visit: Payer: Self-pay | Admitting: Family Medicine

## 2020-04-15 NOTE — Telephone Encounter (Signed)
Already addressed

## 2020-05-06 ENCOUNTER — Other Ambulatory Visit: Payer: Self-pay | Admitting: Family Medicine

## 2020-05-07 MED ORDER — DEXTROAMPHETAMINE SULFATE 5 MG PO TABS
5.0000 mg | ORAL_TABLET | Freq: Every day | ORAL | 0 refills | Status: DC
Start: 2020-05-07 — End: 2021-01-19

## 2020-05-07 NOTE — Telephone Encounter (Signed)
Refill request for Dextrostat 5 mg tablets  LOV - 09/27/19 NOV - not scheduled Last refilled - 08/28/19 #90/0

## 2020-05-07 NOTE — Telephone Encounter (Signed)
Sent. Thanks.   

## 2020-05-27 DIAGNOSIS — Z23 Encounter for immunization: Secondary | ICD-10-CM | POA: Diagnosis not present

## 2020-07-21 ENCOUNTER — Other Ambulatory Visit: Payer: Self-pay | Admitting: Family Medicine

## 2020-07-22 ENCOUNTER — Other Ambulatory Visit: Payer: Self-pay | Admitting: Family Medicine

## 2020-07-22 NOTE — Telephone Encounter (Signed)
Mom (mrs. Austin Kelly) called in and stated that he is out of medication and needs it . I have him schedule for CPE  8/15 @930 

## 2020-07-22 NOTE — Telephone Encounter (Signed)
LOV - 09/27/19 NOV - 09/29/20 Last refilled - 03/07/20 x 3 #30/0

## 2020-07-22 NOTE — Telephone Encounter (Signed)
pts mom called back and pt took last vyvanse 07/22/20; pts mom is trying to give pt responsibility to call in time for his refill but so far pt cannot remember. pts mom request refill sent in today. [facility] will notify pts mom when ready for pick up. Thank you. pts mom said pt usually gets 3 rx at a time.

## 2020-07-23 MED ORDER — LISDEXAMFETAMINE DIMESYLATE 60 MG PO CAPS: 60.0000 mg | ORAL_CAPSULE | Freq: Every day | ORAL | 0 refills | Status: DC

## 2020-07-23 MED ORDER — LISDEXAMFETAMINE DIMESYLATE 60 MG PO CAPS
60.0000 mg | ORAL_CAPSULE | ORAL | 0 refills | Status: DC
Start: 2020-07-23 — End: 2020-09-29

## 2020-07-23 NOTE — Telephone Encounter (Signed)
Done. Thanks.

## 2020-07-23 NOTE — Telephone Encounter (Signed)
These have already been refilled today. Dr. Para March will have to deny and close encounter.

## 2020-07-23 NOTE — Telephone Encounter (Signed)
Sent. Thanks.   

## 2020-09-29 ENCOUNTER — Ambulatory Visit (INDEPENDENT_AMBULATORY_CARE_PROVIDER_SITE_OTHER): Payer: BC Managed Care – PPO | Admitting: Family Medicine

## 2020-09-29 ENCOUNTER — Encounter: Payer: Self-pay | Admitting: Family Medicine

## 2020-09-29 ENCOUNTER — Other Ambulatory Visit: Payer: Self-pay

## 2020-09-29 VITALS — BP 114/78 | HR 69 | Temp 98.3°F | Ht 70.0 in | Wt 150.0 lb

## 2020-09-29 DIAGNOSIS — Z0000 Encounter for general adult medical examination without abnormal findings: Secondary | ICD-10-CM | POA: Diagnosis not present

## 2020-09-29 DIAGNOSIS — L309 Dermatitis, unspecified: Secondary | ICD-10-CM

## 2020-09-29 DIAGNOSIS — F909 Attention-deficit hyperactivity disorder, unspecified type: Secondary | ICD-10-CM

## 2020-09-29 DIAGNOSIS — Z7189 Other specified counseling: Secondary | ICD-10-CM

## 2020-09-29 MED ORDER — TRIAMCINOLONE ACETONIDE 0.1 % EX CREA: 1.0000 "application " | TOPICAL_CREAM | Freq: Two times a day (BID) | CUTANEOUS | 3 refills | Status: AC | PRN

## 2020-09-29 MED ORDER — LISDEXAMFETAMINE DIMESYLATE 60 MG PO CAPS: 60.0000 mg | ORAL_CAPSULE | Freq: Every day | ORAL | 0 refills | Status: DC

## 2020-09-29 NOTE — Patient Instructions (Signed)
Don't change your meds for now.  Update me as needed.  Use TAC on the rash as needed.  I would get a flu shot each fall.   Take care.  Glad to see you.

## 2020-09-29 NOTE — Progress Notes (Signed)
This visit occurred during the SARS-CoV-2 public health emergency.  Safety protocols were in place, including screening questions prior to the visit, additional usage of staff PPE, and extensive cleaning of exam room while observing appropriate contact time as indicated for disinfecting solutions.  CPE- See plan.  Routine anticipatory guidance given to patient.  See health maintenance.  The possibility exists that previously documented standard health maintenance information may have been brought forward from a previous encounter into this note.  If needed, that same information has been updated to reflect the current situation based on today's encounter  Tetanus 2021 Flu due fall.   covid vaccine 2021 PNA and shingles not due.  Colon and prostate cancer screening not due.  Living will d/w pt.  Parents designated if patient were incapacitated. Diet and exercise d/w pt.  Doing well with both.    ADD.  Still on baseline meds.  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  No ADE on med.  No tremor.  No insomnia.  He noted sig effect with med.  He forgot one day and could tell a sig change.    Eczema in popliteal area.  Not really itchy but noted when he gets hot.  Topical cortisone didn't fix it.    [facility] [address] [place] [facility]  [facility].    PMH and SH reviewed  Meds, vitals, and allergies reviewed.   ROS: Per HPI.  Unless specifically indicated otherwise in HPI, the patient denies:  General: fever. Eyes: acute vision changes ENT: sore throat Cardiovascular: chest pain Respiratory: SOB GI: vomiting GU: dysuria Musculoskeletal: acute back pain Derm: acute rash Neuro: acute motor dysfunction Psych: worsening mood Endocrine: polydipsia Heme: bleeding Allergy: hayfever  GEN: nad, alert and oriented HEENT: ncat NECK: supple w/o LA CV: rrr. PULM: ctab, no inc wob ABD: soft, +bs EXT:  no edema SKIN: no acute rash but R popliteal eczema noted.   No tremor.

## 2020-10-01 NOTE — Assessment & Plan Note (Signed)
Still on baseline meds.  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  No ADE on med.  No tremor.  No insomnia.  He noted sig effect with med.  He forgot one day and could tell a sig change.    Continue as needed Dextrostat with daily Compazine and Vyvanse.

## 2020-10-01 NOTE — Assessment & Plan Note (Signed)
Living will d/w pt.  Parents designated if patient were incapacitated.  °

## 2020-10-01 NOTE — Assessment & Plan Note (Signed)
Can use triamcinolone and update me as needed.

## 2020-10-01 NOTE — Assessment & Plan Note (Signed)
Tetanus 2021 Flu due fall.   covid vaccine 2021 PNA and shingles not due.  Colon and prostate cancer screening not due.  Living will d/w pt.  Parents designated if patient were incapacitated. Diet and exercise d/w pt.  Doing well with both.

## 2020-10-27 ENCOUNTER — Encounter: Payer: Self-pay | Admitting: Family Medicine

## 2020-10-29 ENCOUNTER — Other Ambulatory Visit: Payer: Self-pay | Admitting: Family Medicine

## 2020-10-29 MED ORDER — LISDEXAMFETAMINE DIMESYLATE 60 MG PO CAPS: 60.0000 mg | ORAL_CAPSULE | Freq: Every day | ORAL | 0 refills | Status: DC

## 2021-01-19 ENCOUNTER — Other Ambulatory Visit: Payer: Self-pay | Admitting: Family Medicine

## 2021-01-20 NOTE — Telephone Encounter (Signed)
Refill request for dextroamphetamine (DEXTROSTAT) 5 MG tablet  LOV - 09/29/20 Next OV - not scheduled Last refill - 05/07/20 #90/0

## 2021-01-21 MED ORDER — DEXTROAMPHETAMINE SULFATE 5 MG PO TABS: 5.0000 mg | ORAL_TABLET | Freq: Every day | ORAL | 0 refills | Status: DC

## 2021-01-21 NOTE — Telephone Encounter (Signed)
Sent. Thanks.   

## 2021-01-29 ENCOUNTER — Telehealth: Payer: Self-pay | Admitting: Family Medicine

## 2021-01-29 NOTE — Telephone Encounter (Signed)
°  Encourage patient to contact the pharmacy for refills or they can request refills through [facility] ° °LAST APPOINTMENT DATE:  Please schedule appointment if longer than 1 year ° °NEXT APPOINTMENT DATE: ° °MEDICATION:lisdexamfetamine (VYVANSE) 60 MG capsule ° °Is the patient out of medication?  ° °[id number] [place]  [address] [facility]  ° °Let patient know to contact pharmacy at the end of the day to make sure medication is ready. ° °Please notify patient to allow 48-72 hours to process ° °CLINICAL FILLS OUT ALL BELOW:  ° °LAST REFILL: ° °QTY: ° °REFILL DATE: ° ° ° °OTHER COMMENTS:  ° ° °Okay for refill? ° °Please advise ° ° ° ° °

## 2021-01-29 NOTE — Telephone Encounter (Signed)
LOV - 09/29/20 NOV - not scheduled RF - 10/29/20 #90/0

## 2021-01-30 MED ORDER — LISDEXAMFETAMINE DIMESYLATE 60 MG PO CAPS: 60.0000 mg | ORAL_CAPSULE | Freq: Every day | ORAL | 0 refills | Status: DC

## 2021-01-30 NOTE — Telephone Encounter (Signed)
Sent. Thanks.   

## 2021-01-30 NOTE — Addendum Note (Signed)
Addended by: Joaquim Nam on: 01/30/2021 03:20 PM   Modules accepted: Orders

## 2021-02-26 ENCOUNTER — Telehealth: Payer: Self-pay

## 2021-02-26 NOTE — Telephone Encounter (Signed)
[facility] [place] [facility] and patient needs a refill on the Vyvanse 60 mg caps. A 90 day prescription was sent in [facility] the [facility] [place] but they did not have the entire rx to give the patient but they wanted it anyway. So only a 30 day was filled and they can not use the rest of the rx. Patient is requesting a new rx be [facility] the [facility] in [place].

## 2021-02-27 MED ORDER — LISDEXAMFETAMINE DIMESYLATE 60 MG PO CAPS: 60.0000 mg | ORAL_CAPSULE | Freq: Every day | ORAL | 0 refills | Status: DC

## 2021-02-27 NOTE — Telephone Encounter (Signed)
Sent. Thanks.   

## 2021-02-27 NOTE — Telephone Encounter (Signed)
Left VM for patient that rx was sent in.

## 2021-02-27 NOTE — Addendum Note (Signed)
Addended by: Joaquim Nam on: 02/27/2021 07:36 AM   Modules accepted: Orders

## 2021-04-27 ENCOUNTER — Other Ambulatory Visit: Payer: Self-pay | Admitting: Family Medicine

## 2021-06-03 ENCOUNTER — Other Ambulatory Visit: Payer: Self-pay | Admitting: Family Medicine

## 2021-06-03 NOTE — Telephone Encounter (Signed)
Refill request Vyvanse ?Last refill 02/27/21 #90 ?Last office visit 09/29/20 ?No upcoming appointment ?Last UDS 07/30/16 ?

## 2021-06-04 MED ORDER — LISDEXAMFETAMINE DIMESYLATE 60 MG PO CAPS: 60.0000 mg | ORAL_CAPSULE | Freq: Every day | ORAL | 0 refills | Status: DC

## 2021-09-04 ENCOUNTER — Telehealth: Payer: Self-pay

## 2021-09-04 NOTE — Telephone Encounter (Signed)
MEDICATION: lisdexamfetamine (VYVANSE) 60 MG capsule  [facility] [address] [place] [address]  Comments:   **Let patient know to contact pharmacy at the end of the day to make sure medication is ready. **  ** Please notify patient to allow 48-72 hours to process**  **Encourage patient to contact the pharmacy for refills or they can request refills through [facility]**

## 2021-09-04 NOTE — Telephone Encounter (Signed)
LOV - 09/29/20 NOV - 10/01/21 RF - 06/04/21 #90/0

## 2021-09-06 MED ORDER — LISDEXAMFETAMINE DIMESYLATE 60 MG PO CAPS: 60.0000 mg | ORAL_CAPSULE | Freq: Every day | ORAL | 0 refills | Status: DC

## 2021-09-06 NOTE — Addendum Note (Signed)
Addended by: Joaquim Nam on: 09/06/2021 02:00 PM   Modules accepted: Orders

## 2021-09-21 ENCOUNTER — Other Ambulatory Visit: Payer: Self-pay | Admitting: Family Medicine

## 2021-09-21 MED ORDER — LISDEXAMFETAMINE DIMESYLATE 60 MG PO CAPS: 60.0000 mg | ORAL_CAPSULE | Freq: Every day | ORAL | 0 refills | Status: DC

## 2021-09-21 NOTE — Telephone Encounter (Signed)
Spoke to patient's dad Kathlene November and was advised that patient was not able to [facility]. Kathlene November stated that he has called around and was only able to find #60 at [facility].

## 2021-09-21 NOTE — Telephone Encounter (Signed)
  Encourage patient to contact the pharmacy for refills or they can request refills through [facility]  Did the patient contact the pharmacy:  yes   LAST APPOINTMENT DATE:  Please schedule appointment if longer than 1 year  NEXT APPOINTMENT DATE:10/01/2021  MEDICATION:lisdexamfetamine (VYVANSE) 60 MG capsule  Is the patient out of medication? yes  If not, how much is left?  Is this a 90 day supply: no  [facility] [address]  Let patient know to contact pharmacy at the end of the day to make sure medication is ready.  Please notify patient to allow 48-72 hours to process   [facility] [address] only [facility] 60 day supply.

## 2021-09-21 NOTE — Telephone Encounter (Signed)
I've sent Vyvanse to [facility] pharmacy per pt request.

## 2021-10-01 ENCOUNTER — Ambulatory Visit (INDEPENDENT_AMBULATORY_CARE_PROVIDER_SITE_OTHER): Payer: BC Managed Care – PPO | Admitting: Family Medicine

## 2021-10-01 ENCOUNTER — Encounter: Payer: Self-pay | Admitting: Family Medicine

## 2021-10-01 VITALS — BP 104/70 | HR 82 | Temp 98.0°F | Ht 70.0 in | Wt 149.0 lb

## 2021-10-01 DIAGNOSIS — Z0000 Encounter for general adult medical examination without abnormal findings: Secondary | ICD-10-CM | POA: Diagnosis not present

## 2021-10-01 DIAGNOSIS — F909 Attention-deficit hyperactivity disorder, unspecified type: Secondary | ICD-10-CM

## 2021-10-01 DIAGNOSIS — Z7189 Other specified counseling: Secondary | ICD-10-CM

## 2021-10-01 DIAGNOSIS — J309 Allergic rhinitis, unspecified: Secondary | ICD-10-CM

## 2021-10-01 DIAGNOSIS — Z23 Encounter for immunization: Secondary | ICD-10-CM

## 2021-10-01 NOTE — Patient Instructions (Addendum)
Let me know about 10 days prior to needing a refill.  Take care.  Glad to see you. Flu shot today.

## 2021-10-01 NOTE — Progress Notes (Signed)
CPE- See plan.  Routine anticipatory guidance given to patient.  See health maintenance.  The possibility exists that previously documented standard health maintenance information may have been brought forward from a previous encounter into this note.  If needed, that same information has been updated to reflect the current situation based on today's encounter.    Tetanus 2021 Flu 2023 covid vaccine 2021 PNA and shingles not due.  Colon and prostate cancer screening not due.  Living will d/w pt.  Parents designated if patient were incapacitated. Diet and exercise d/w pt.  Doing well with both.   Opted out of HIV and HCV testing 2023, low risk, d/w pt.    He has 1 semester left at [employer].  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    ADD d/w pt.  D/w pt about school vs interning with med use and effect.  Still with effect from med.  He noted a change off med when it was back ordered.  No insomnia, no tremor.  Appetite is good.  He was more distracted off med, it took longer to get jobs done.    Rare nosebleed, a few times a year.  Usually mild, self resolves with pinching.  No other bleeding.  R>L nostril congestion.  He is off antihistamines in the meantime.  D/w pt about restart.    PMH and SH reviewed  Meds, vitals, and allergies reviewed.   ROS: Per HPI.  Unless specifically indicated otherwise in HPI, the patient denies:  General: fever. Eyes: acute vision changes ENT: sore throat Cardiovascular: chest pain Respiratory: SOB GI: vomiting GU: dysuria Musculoskeletal: acute back pain Derm: acute rash Neuro: acute motor dysfunction Psych: worsening mood Endocrine: polydipsia Heme: bleeding Allergy: hayfever  GEN: nad, alert and oriented HEENT: ncat, no bleeding but right greater than left nostril congestion noted. NECK: supple w/o LA CV: rrr. PULM: ctab, no inc wob ABD: soft, +bs EXT: no edema SKIN: no acute rash

## 2021-10-04 NOTE — Assessment & Plan Note (Signed)
Living will d/w pt.  Parents designated if patient were incapacitated.  °

## 2021-10-04 NOTE — Assessment & Plan Note (Signed)
He has 1 semester left at [employer].  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   Would continue Vyvanse and guanfacine as is.  Can use Dextrostat if needed.  Routine cautions given to patient.

## 2021-10-04 NOTE — Assessment & Plan Note (Signed)
Can restart Claritin and update me as needed.  If he has more nosebleeds or progressive symptoms he can let me know.

## 2021-10-04 NOTE — Assessment & Plan Note (Signed)
Tetanus 2021 Flu 2023 covid vaccine 2021 PNA and shingles not due.  Colon and prostate cancer screening not due.  Living will d/w pt.  Parents designated if patient were incapacitated. Diet and exercise d/w pt.  Doing well with both.   Opted out of HIV and HCV testing 2023, low risk, d/w pt.

## 2021-11-16 ENCOUNTER — Encounter: Payer: Self-pay | Admitting: Family Medicine

## 2021-11-16 NOTE — Telephone Encounter (Signed)
Looks like last refill as well as last office visit was 10/01/2021

## 2021-11-17 ENCOUNTER — Telehealth: Payer: Self-pay | Admitting: Family Medicine

## 2021-11-17 MED ORDER — LISDEXAMFETAMINE DIMESYLATE 60 MG PO CAPS: 60.0000 mg | ORAL_CAPSULE | Freq: Every day | ORAL | 0 refills | Status: DC

## 2021-11-17 NOTE — Telephone Encounter (Signed)
Sent. Thanks.   

## 2021-11-17 NOTE — Telephone Encounter (Signed)
Patient called in and stated that [facility] is out of the lisdexamfetamine (VYVANSE) 60 MG capsule. He was wondering if Dr. Damita Dunnings can [id number] [place] [place]. Thank you!

## 2021-11-18 MED ORDER — LISDEXAMFETAMINE DIMESYLATE 60 MG PO CAPS: 60.0000 mg | ORAL_CAPSULE | Freq: Every day | ORAL | 0 refills | Status: DC

## 2021-11-18 NOTE — Addendum Note (Signed)
Addended by: Tonia Ghent on: 11/18/2021 08:37 AM   Modules accepted: Orders

## 2022-02-16 ENCOUNTER — Telehealth: Payer: Self-pay | Admitting: Family Medicine

## 2022-02-16 NOTE — Telephone Encounter (Signed)
LOV - 10/01/21 NOV - N/A RF - 11/18/21 #90/0

## 2022-02-16 NOTE — Telephone Encounter (Signed)
Caller Name: Mikiah  Call back phone #: [phone]  MEDICATION(S): lisdexamfetamine (VYVANSE) 60 MG capsule   Days of Med Remaining: 4  Has the patient contacted their pharmacy (YES/NO)? NO What did pharmacy advise?   Preferred Pharmacy:  [facility] [address]   ~~~Please advise patient/caregiver to allow 2-3 business days to process RX refills.

## 2022-02-17 ENCOUNTER — Encounter: Payer: Self-pay | Admitting: Family Medicine

## 2022-02-17 ENCOUNTER — Other Ambulatory Visit: Payer: Self-pay | Admitting: Family Medicine

## 2022-02-17 MED ORDER — LISDEXAMFETAMINE DIMESYLATE 60 MG PO CAPS: 60.0000 mg | ORAL_CAPSULE | Freq: Every day | ORAL | 0 refills | Status: DC

## 2022-02-17 MED ORDER — DEXTROAMPHETAMINE SULFATE 5 MG PO TABS: 5.0000 mg | ORAL_TABLET | Freq: Every day | ORAL | 0 refills | Status: DC

## 2022-02-17 NOTE — Telephone Encounter (Signed)
Sent. Thanks.   

## 2022-02-17 NOTE — Telephone Encounter (Signed)
Last filled 01-21-22 #90 Last OV 10-01-21 No Future OV [facility] [address]

## 2022-02-17 NOTE — Addendum Note (Signed)
Addended by: Tonia Ghent on: 02/17/2022 02:57 PM   Modules accepted: Orders

## 2022-02-17 NOTE — Telephone Encounter (Signed)
Kerrin Mo "[name]"  to [facility] (supporting Tonia Ghent, MD)      02/17/22  2:45 PM I just requested a refill of dextroamphetamine be sent to [facility]. As my insurance has changed, that pharmacy is now out of network. Can that request be sent to the [facility] on [address].  The same place I requested the Vyvanse be sent.   Thank you,    [name]

## 2022-03-26 ENCOUNTER — Telehealth: Payer: Self-pay | Admitting: Family Medicine

## 2022-03-26 NOTE — Telephone Encounter (Signed)
Prescription Request  03/26/2022  Is this a "Controlled Substance" medicine? No  LOV: 10/01/2021  What is the name of the medication or equipment? lisdexamfetamine (VYVANSE) 60 MG capsule   Have you contacted your pharmacy to request a refill? No   Which pharmacy would you like this sent to?  [facility] ([address] [place] [id number] [place]) [place] [address] Phone: [phone] Fax: [phone]    Patient notified that their request is being sent to the clinical staff for review and that they should receive a response within 2 business days.   Please advise at Mobile [phone] (mobile)

## 2022-03-26 NOTE — Telephone Encounter (Signed)
LOV - 10/01/21 NOV - not scheduled RF - 02/17/22 #90/0

## 2022-03-27 MED ORDER — LISDEXAMFETAMINE DIMESYLATE 60 MG PO CAPS: 60.0000 mg | ORAL_CAPSULE | Freq: Every day | ORAL | 0 refills | Status: DC

## 2022-03-27 NOTE — Telephone Encounter (Signed)
Sent. Thanks.   

## 2022-03-27 NOTE — Addendum Note (Signed)
Addended by: Tonia Ghent on: 03/27/2022 10:29 AM   Modules accepted: Orders

## 2022-05-04 ENCOUNTER — Other Ambulatory Visit: Payer: Self-pay | Admitting: Family Medicine

## 2022-06-24 ENCOUNTER — Telehealth: Payer: Self-pay | Admitting: Family Medicine

## 2022-06-24 NOTE — Telephone Encounter (Signed)
LOV - 10/01/21 NOV - not scheduled RF - 03/27/22 #90/0

## 2022-06-24 NOTE — Telephone Encounter (Signed)
Prescription Request  06/24/2022  LOV: 10/01/2021  What is the name of the medication or equipment? lisdexamfetamine (VYVANSE) 60 MG capsule, pharmacy has enough for 30 days  Have you contacted your pharmacy to request a refill? Yes   Which pharmacy would you like this sent to?   [id number] [place], [place] - [address] AT [facility] [address] [place] [address] Phone: [phone] Fax: [phone]    Patient notified that their request is being sent to the clinical staff for review and that they should receive a response within 2 business days.   Please advise at Mobile [phone] (mobile)

## 2022-06-25 MED ORDER — LISDEXAMFETAMINE DIMESYLATE 60 MG PO CAPS: 60.0000 mg | ORAL_CAPSULE | Freq: Every day | ORAL | 0 refills | Status: DC

## 2022-06-25 NOTE — Addendum Note (Signed)
Addended by: Joaquim Nam on: 06/25/2022 07:57 AM   Modules accepted: Orders

## 2022-09-28 ENCOUNTER — Other Ambulatory Visit: Payer: Self-pay | Admitting: Family Medicine

## 2022-09-30 NOTE — Telephone Encounter (Signed)
Pt called in to know status of rx refill lisdexamfetamine (VYVANSE) 60 MG capsule

## 2022-09-30 NOTE — Telephone Encounter (Signed)
Refill request for   lisdexamfetamine (VYVANSE) 60 MG capsule    LOV - 10/01/21 Next OV - 10/15/22 Last refill - 06/25/22 #90/0

## 2022-10-01 MED ORDER — LISDEXAMFETAMINE DIMESYLATE 60 MG PO CAPS: 60.0000 mg | ORAL_CAPSULE | Freq: Every day | ORAL | 0 refills | Status: DC

## 2022-10-01 NOTE — Telephone Encounter (Signed)
Sent. Thanks.   

## 2022-10-15 ENCOUNTER — Ambulatory Visit (INDEPENDENT_AMBULATORY_CARE_PROVIDER_SITE_OTHER): Payer: Managed Care, Other (non HMO) | Admitting: Family Medicine

## 2022-10-15 ENCOUNTER — Encounter: Payer: Self-pay | Admitting: Family Medicine

## 2022-10-15 VITALS — BP 120/80 | HR 97 | Temp 98.8°F | Ht 70.0 in | Wt 156.0 lb

## 2022-10-15 DIAGNOSIS — Z7189 Other specified counseling: Secondary | ICD-10-CM

## 2022-10-15 DIAGNOSIS — Z0000 Encounter for general adult medical examination without abnormal findings: Secondary | ICD-10-CM

## 2022-10-15 DIAGNOSIS — Z23 Encounter for immunization: Secondary | ICD-10-CM

## 2022-10-15 DIAGNOSIS — F909 Attention-deficit hyperactivity disorder, unspecified type: Secondary | ICD-10-CM

## 2022-10-15 MED ORDER — DEXTROAMPHETAMINE SULFATE 5 MG PO TABS: 5.0000 mg | ORAL_TABLET | Freq: Every day | ORAL | 0 refills | Status: AC

## 2022-10-15 MED ORDER — GUANFACINE HCL ER 2 MG PO TB24: 2.0000 mg | ORAL_TABLET | Freq: Every day | ORAL | 3 refills | Status: DC

## 2022-10-15 NOTE — Progress Notes (Unsigned)
CPE- See plan.  Routine anticipatory guidance given to patient.  See health maintenance.  The possibility exists that previously documented standard health maintenance information may have been brought forward from a previous encounter into this note.  If needed, that same information has been updated to reflect the current situation based on today's encounter.    Tetanus 2021 Flu 2024 covid vaccine 2021 PNA and shingles not due.  Colon and prostate cancer screening not due.  Living will d/w pt.  Parents designated if patient were incapacitated. Diet and exercise d/w pt.  Doing well with both.   Opted out of HIV and HCV testing 2023, low risk, d/w pt.     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     ADD d/w pt.  Still with effect from med.  He noted a change off med.  No insomnia, no tremor.  Appetite is good.  He was more distracted off med, it took longer to get jobs done.    D/w pt about getting rxs set up through [facility].  Rx sent.    No interval nosebleeds.   PMH and SH reviewed  Meds, vitals, and allergies reviewed.   ROS: Per HPI.  Unless specifically indicated otherwise in HPI, the patient denies:  General: fever. Eyes: acute vision changes ENT: sore throat Cardiovascular: chest pain Respiratory: SOB GI: vomiting GU: dysuria Musculoskeletal: acute back pain Derm: acute rash Neuro: acute motor dysfunction Psych: worsening mood Endocrine: polydipsia Heme: bleeding Allergy: hayfever  GEN: nad, alert and oriented HEENT: ncat NECK: supple w/o LA CV: rrr. PULM: ctab, no inc wob ABD: soft, +bs EXT: no edema SKIN: no acute rash CN 2-12 wnl B, S/S wnl x4

## 2022-10-15 NOTE — Patient Instructions (Signed)
Flu shot today.  Update [place] as needed.  Take care.  Glad to see you.

## 2022-10-18 NOTE — Assessment & Plan Note (Signed)
Still with effect from med.  He noted a change off med.  No insomnia, no tremor.  Appetite is good.  He was more distracted off med, it took longer to get jobs done.  Would continue as is with guanfacine/Vyvanse with as needed dextroamphetamine.

## 2022-10-18 NOTE — Assessment & Plan Note (Signed)
Tetanus 2021 Flu 2024 covid vaccine 2021 PNA and shingles not due.  Colon and prostate cancer screening not due.  Living will d/w pt.  Parents designated if patient were incapacitated. Diet and exercise d/w pt.  Doing well with both.

## 2022-10-18 NOTE — Assessment & Plan Note (Signed)
Living will d/w pt.  Parents designated if patient were incapacitated.

## 2022-12-29 ENCOUNTER — Other Ambulatory Visit: Payer: Self-pay | Admitting: Family Medicine

## 2022-12-29 MED ORDER — LISDEXAMFETAMINE DIMESYLATE 60 MG PO CAPS: 60.0000 mg | ORAL_CAPSULE | Freq: Every day | ORAL | 0 refills | Status: DC

## 2022-12-29 NOTE — Telephone Encounter (Signed)
Requesting: lisdexamfetamine (VYVANSE) 60 MG capsule  Contract: Yes UDS: 07/30/2016 Last Visit: 10/15/2022 Next Visit: None scheduled Last Refill: 10/01/22  Please Advise

## 2022-12-29 NOTE — Telephone Encounter (Signed)
Prescription Request  12/29/2022  LOV: 10/15/2022  What is the name of the medication or equipment?  lisdexamfetamine (VYVANSE) 60 MG capsule  Have you contacted your pharmacy to request a refill? No   Which pharmacy would you like this sent to?   [id number] [place], [place] - [address] [place] [address] Phone: [phone] Fax: [phone]   Patient notified that their request is being sent to the clinical staff for review and that they should receive a response within 2 business days.   Please advise at Mobile [phone] (mobile)

## 2023-04-01 ENCOUNTER — Other Ambulatory Visit: Payer: Self-pay | Admitting: Family Medicine

## 2023-04-04 MED ORDER — LISDEXAMFETAMINE DIMESYLATE 60 MG PO CAPS: 60.0000 mg | ORAL_CAPSULE | Freq: Every day | ORAL | 0 refills | Status: DC

## 2023-04-04 NOTE — Telephone Encounter (Signed)
Last refill: lisdexamfetamine (VYVANSE) 60 MG capsule 12/29/22 90 capsules 0 refills Last office visit: 10/15/22 Next office visit: nothing scheduled

## 2023-07-07 ENCOUNTER — Other Ambulatory Visit: Payer: Self-pay | Admitting: Family Medicine

## 2023-07-07 NOTE — Telephone Encounter (Signed)
 Last Fill: 04/04/23 90 tabs/0 RF  Last OV: 10/15/22 CPE Next OV: None Scheduled  Routing to provider for review/authorization.

## 2023-07-07 NOTE — Telephone Encounter (Unsigned)
 Copied from CRM [id number]. Topic: Clinical - Medication Refill >> Jul 07, 2023  9:36 AM [place] J wrote: Medication: lisdexamfetamine (VYVANSE ) 60 MG  Has the patient contacted their pharmacy? Yes (Agent: If no, request that the patient contact the pharmacy for the refill. If patient does not wish to contact the pharmacy document the reason why and proceed with request.) (Agent: If yes, when and what did the pharmacy advise?) Medication is controlled substance, pharmacy could not refill.  [facility] [place], [place] - [address] [place] [address] Phone: [phone] Fax: [phone]  Is this the correct pharmacy for this prescription? Yes If no, delete pharmacy and type the correct one.   Has the prescription been filled recently? No  Is the patient out of the medication? No  Has the patient been seen for an appointment in the last year OR does the patient have an upcoming appointment? Yes  Can we respond through MyChart? Yes  Agent: Please be advised that Rx refills may take up to 3 business days. We ask that you follow-up with your pharmacy.

## 2023-07-11 MED ORDER — LISDEXAMFETAMINE DIMESYLATE 60 MG PO CAPS: 60.0000 mg | ORAL_CAPSULE | Freq: Every day | ORAL | 0 refills | Status: DC

## 2023-07-12 ENCOUNTER — Telehealth: Payer: Self-pay | Admitting: Family Medicine

## 2023-07-12 NOTE — Telephone Encounter (Signed)
 Copied from CRM [id number]. Topic: Clinical - Medication Refill >> Jul 12, 2023  1:35 PM Earnestine Goes B wrote: Medication: lisdexamfetamine (VYVANSE ) 60 MG capsule  Has the patient contacted their pharmacy? Yes (Agent: If no, request that the patient contact the pharmacy for the refill. If patient does not wish to contact the pharmacy document the reason why and proceed with request.) (Agent: If yes, when and what did the pharmacy advise?)  This is the patient's preferred pharmacy:    [facility] [place], [place] - [address] [place] [address] Phone: [phone] Fax: [phone]      Is this the correct pharmacy for this prescription? Yes If no, delete pharmacy and type the correct one.   Has the prescription been filled recently? Yes  Is the patient out of the medication? Yes  Has the patient been seen for an appointment in the last year OR does the patient have an upcoming appointment? Yes  Can we respond through MyChart? Yes  Agent: Please be advised that Rx refills may take up to 3 business days. We ask that you follow-up with your pharmacy.

## 2023-07-13 NOTE — Telephone Encounter (Signed)
 Per chart review, refill was sent to the pharmacy on 5/26. RN called the pt and LVM asking him to follow-up with his pharmacy

## 2023-07-13 NOTE — Telephone Encounter (Signed)
 Reached out to patient and he was not home but his father answered the phone and said that patient picked up his medication

## 2023-10-11 ENCOUNTER — Other Ambulatory Visit: Payer: Self-pay | Admitting: Family Medicine

## 2023-10-11 NOTE — Telephone Encounter (Signed)
 Copied from CRM [id number]. Topic: Clinical - Medication Refill >> Oct 11, 2023 10:03 AM Pinkey ORN wrote: Medication: lisdexamfetamine (VYVANSE ) 60 MG capsule  Has the patient contacted their pharmacy? Yes (Agent: If no, request that the patient contact the pharmacy for the refill. If patient does not wish to contact the pharmacy document the reason why and proceed with request.) (Agent: If yes, when and what did the pharmacy advise?)  This is the patient's preferred pharmacy:   [facility] [address], [place] - [address] [place] [address] Phone: [phone] Fax: [phone]   Is this the correct pharmacy for this prescription? Yes If no, delete pharmacy and type the correct one.   Has the prescription been filled recently? No  Is the patient out of the medication? Yes  Has the patient been seen for an appointment in the last year OR does the patient have an upcoming appointment? No  Can we respond through MyChart? Yes  Agent: Please be advised that Rx refills may take up to 3 business days. We ask that you follow-up with your pharmacy.

## 2023-10-11 NOTE — Telephone Encounter (Signed)
 07/11/23 90 tabs/0 RF

## 2023-10-14 ENCOUNTER — Other Ambulatory Visit: Payer: Self-pay | Admitting: Family Medicine

## 2023-10-14 MED ORDER — LISDEXAMFETAMINE DIMESYLATE 60 MG PO CAPS: 60.0000 mg | ORAL_CAPSULE | Freq: Every day | ORAL | 0 refills | Status: DC

## 2023-10-14 NOTE — Telephone Encounter (Signed)
 LAST APPOINTMENT DATE: 10/15/23 NEXT APPOINTMENT DATE: 10/20/2023    LAST REFILL:10/15/22  QTY: #90 w/ 3 refills

## 2023-10-20 ENCOUNTER — Ambulatory Visit: Admitting: Family Medicine

## 2023-10-20 ENCOUNTER — Encounter: Payer: Self-pay | Admitting: Family Medicine

## 2023-10-20 VITALS — BP 126/78 | HR 74 | Temp 98.1°F | Ht 70.0 in | Wt 160.8 lb

## 2023-10-20 DIAGNOSIS — F909 Attention-deficit hyperactivity disorder, unspecified type: Secondary | ICD-10-CM

## 2023-10-20 DIAGNOSIS — Z0000 Encounter for general adult medical examination without abnormal findings: Secondary | ICD-10-CM | POA: Diagnosis not present

## 2023-10-20 DIAGNOSIS — Z7189 Other specified counseling: Secondary | ICD-10-CM

## 2023-10-20 NOTE — Progress Notes (Signed)
 CPE- See plan.  Routine anticipatory guidance given to patient.  See health maintenance.  The possibility exists that previously documented standard health maintenance information may have been brought forward from a previous encounter into this note.  If needed, that same information has been updated to reflect the current situation based on today's encounter.    Tetanus 2021 Flu d/w pt.  covid vaccine 2021 PNA and shingles not due.  Colon and prostate cancer screening not due.  Living will d/w pt.  Parents designated if patient were incapacitated. Diet and exercise d/w pt.  Doing well with both.  Reasonable to defer labs given his age and overall health.  D/w pt.  He agrees.    He just bought a house, just moved in.  D/w pt about updating his address.    He is working at [employer].     ADD d/w pt.  Still with effect from med.  He had noted a change off med.  No insomnia, no tremor.  Appetite is still good.  He was more distracted off med, it took longer to get jobs done.  Work is going well, ie efficiency, and that is partly attributed to med use.    PMH and SH reviewed  Meds, vitals, and allergies reviewed.   ROS: Per HPI.  Unless specifically indicated otherwise in HPI, the patient denies:  General: fever. Eyes: acute vision changes ENT: sore throat Cardiovascular: chest pain Respiratory: SOB GI: vomiting GU: dysuria Musculoskeletal: acute back pain Derm: acute rash Neuro: acute motor dysfunction Psych: worsening mood Endocrine: polydipsia Heme: bleeding Allergy: hayfever  GEN: nad, alert and oriented HEENT: mucous membranes moist NECK: supple w/o LA CV: rrr. PULM: ctab, no inc wob ABD: soft, +bs EXT: no edema SKIN: no acute rash

## 2023-10-20 NOTE — Patient Instructions (Addendum)
 Please update your address and I would get a flu shot each fall.   Take care.  Glad to see you. Please check about getting vyvanse  [facility].  Let me know if you want that done with the next rx.

## 2023-10-20 NOTE — Assessment & Plan Note (Signed)
 No change in meds.  See above.  Continue as is with guanfacine  dextrostat  and vyvanse .  I asked him to [facility] about [facility]ill for vyvanse  and let me know if he wants the med sent there.

## 2023-10-20 NOTE — Assessment & Plan Note (Signed)
Living will d/w pt.  Parents designated if patient were incapacitated.

## 2023-10-20 NOTE — Assessment & Plan Note (Signed)
 Tetanus 2021 Flu d/w pt.  covid vaccine 2021 PNA and shingles not due.  Colon and prostate cancer screening not due.  Living will d/w pt.  Parents designated if patient were incapacitated. Diet and exercise d/w pt.  Doing well with both.

## 2024-01-16 ENCOUNTER — Other Ambulatory Visit: Payer: Self-pay | Admitting: *Deleted

## 2024-01-16 ENCOUNTER — Ambulatory Visit: Admitting: Family Medicine

## 2024-01-16 ENCOUNTER — Encounter: Payer: Self-pay | Admitting: Family Medicine

## 2024-01-16 VITALS — BP 134/76 | HR 100 | Temp 98.3°F | Ht 70.0 in | Wt 157.1 lb

## 2024-01-16 DIAGNOSIS — Z23 Encounter for immunization: Secondary | ICD-10-CM

## 2024-01-16 DIAGNOSIS — R197 Diarrhea, unspecified: Secondary | ICD-10-CM

## 2024-01-16 MED ORDER — LISDEXAMFETAMINE DIMESYLATE 60 MG PO CAPS: 60.0000 mg | ORAL_CAPSULE | Freq: Every day | ORAL | 0 refills | Status: AC

## 2024-01-16 NOTE — Assessment & Plan Note (Signed)
 6 days  Watery frequent stool, dec appetite and no other symptoms  Did occur after eating left overs  No sick contacts Is well hydrated Reassuring exam   Given length of illness want to r/o infx source  Lab today  Stool test for GI profile and c diff  Update if not starting to improve in a week or if worsening  Call back and Er precautions noted in detail today

## 2024-01-16 NOTE — Progress Notes (Signed)
 Subjective:    Patient ID: Austin Kelly, male    DOB: [birth year], 28 y.o.   MRN: [id number]  HPI  Wt Readings from Last 3 Encounters:  01/16/24 157 lb 2 oz (71.3 kg)  10/20/23 160 lb 12.8 oz (72.9 kg)  10/15/22 156 lb (70.8 kg)   22.55 kg/m  Vitals:   01/16/24 1419  BP: 134/76  Pulse: 100  Temp: 98.3 F (36.8 C)  SpO2: 98%    28 yo pt of Dr Cleatus presents with c/o  Diarrhea  6 days   Thought it was something he ate but not getting better   Monday-started in middle of night -woke him up  Had eaten left overs (out too long?)- smoked sausage/rice/beans   Watery stool  For periods of the day up to every 20 minutes For periods can go 2 hours w/o bm  Some gas also   Today- better than it was / slowly improving?  No blood  Gassy /bloated Little cramping  No fever   No nausea or vomiting   No sick contacts  No recent antibiotics   No history of bowel problems Usually once daily  No new medicines  No supplements    Over the counter Took some immodium    No family history of bowel disease    Patient Active Problem List   Diagnosis Date Noted   Diarrhea 01/16/2024   Advance care planning 08/07/2017   Routine general medical examination at a health care facility 06/20/2011   ADHD (attention deficit hyperactivity disorder) 06/20/2011   Eczema 06/11/2011   Allergic rhinitis 06/27/2006   Past Medical History:  Diagnosis Date   ADHD (attention deficit hyperactivity disorder)    Allergy    yearlong   Eczema    Premature baby    born at [redacted] weeks gestation, NICU 4 weeks   Past Surgical History:  Procedure Laterality Date   HYPOSPADIAS CORRECTION  5 YOA   NASAL FRACTURE SURGERY     Social History   Tobacco Use   Smoking status: Never   Smokeless tobacco: Never  Substance Use Topics   Alcohol use: No    Alcohol/week: 0.0 standard drinks of alcohol   Drug use: No   Family History  Problem Relation Age of Onset   Colon cancer Neg Hx     Prostate cancer Neg Hx    No Known Allergies Current Outpatient Medications on File Prior to Visit  Medication Sig Dispense Refill   dextroamphetamine  (DEXTROSTAT ) 5 MG tablet Take 1 tablet (5 mg total) by mouth daily. 90 tablet 0   guanFACINE  (INTUNIV ) 2 MG TB24 ER tablet TAKE 1 TABLET DAILY 90 tablet 3   lisdexamfetamine (VYVANSE ) 60 MG capsule Take 1 capsule (60 mg total) by mouth daily. 90 capsule 0   loratadine  (CLARITIN ) 10 MG tablet Take 10 mg by mouth daily.     triamcinolone  cream (KENALOG ) 0.1 % Apply 1 application topically 2 (two) times daily as needed. 30 g 3   No current facility-administered medications on file prior to visit.    Review of Systems  Constitutional:  Positive for appetite change and fatigue. Negative for activity change, fever and unexpected weight change.  HENT:  Negative for congestion, rhinorrhea, sore throat and trouble swallowing.   Eyes:  Negative for pain, redness, itching and visual disturbance.  Respiratory:  Negative for cough, chest tightness, shortness of breath and wheezing.   Cardiovascular:  Negative for chest pain and palpitations.  Gastrointestinal:  Positive for  diarrhea. Negative for abdominal distention, abdominal pain, anal bleeding, blood in stool, constipation, nausea, rectal pain and vomiting.  Endocrine: Negative for cold intolerance, heat intolerance, polydipsia and polyuria.  Genitourinary:  Negative for difficulty urinating, dysuria, frequency and urgency.  Musculoskeletal:  Negative for arthralgias, joint swelling and myalgias.  Skin:  Negative for pallor and rash.  Neurological:  Negative for dizziness, tremors, weakness, numbness and headaches.  Hematological:  Negative for adenopathy. Does not bruise/bleed easily.  Psychiatric/Behavioral:  Negative for decreased concentration and dysphoric mood. The patient is not nervous/anxious.        Objective:   Physical Exam Constitutional:      General: He is not in acute  distress.    Appearance: Normal appearance. He is well-developed and normal weight. He is not ill-appearing or diaphoretic.  HENT:     Head: Normocephalic and atraumatic.  Eyes:     General: No scleral icterus.    Conjunctiva/sclera: Conjunctivae normal.     Pupils: Pupils are equal, round, and reactive to light.  Cardiovascular:     Rate and Rhythm: Regular rhythm. Tachycardia present.     Heart sounds: Normal heart sounds.     Comments: Rate in mid 90s on exam Pulmonary:     Effort: Pulmonary effort is normal. No respiratory distress.     Breath sounds: Normal breath sounds. No wheezing or rales.  Abdominal:     General: Abdomen is flat. Bowel sounds are normal. There is no distension.     Palpations: Abdomen is soft. There is no fluid wave, hepatomegaly, splenomegaly, mass or pulsatile mass.     Tenderness: There is no abdominal tenderness. There is no guarding or rebound. Negative signs include Murphy's sign and McBurney's sign.  Musculoskeletal:     Cervical back: Normal range of motion and neck supple.  Lymphadenopathy:     Cervical: No cervical adenopathy.  Skin:    General: Skin is warm and dry.     Coloration: Skin is not pale.     Findings: No erythema.  Neurological:     Mental Status: He is alert.     Motor: No weakness.  Psychiatric:        Mood and Affect: Mood normal.           Assessment & Plan:   Problem List Items Addressed This Visit       Other   Diarrhea - Primary   6 days  Watery frequent stool, dec appetite and no other symptoms  Did occur after eating left overs  No sick contacts Is well hydrated Reassuring exam   Given length of illness want to r/o infx source  Lab today  Stool test for GI profile and c diff  Update if not starting to improve in a week or if worsening  Call back and Er precautions noted in detail today        Relevant Orders   CBC with Differential/Platelet   Basic metabolic panel with GFR   Hepatic function  panel   C. difficile GDH and Toxin A/B   GI Profile, Stool, PCR   Other Visit Diagnoses       Need for influenza vaccination       Relevant Orders   Flu vaccine trivalent PF, 6mos and older(Flulaval,Afluria,Fluarix,Fluzone) (Completed)

## 2024-01-16 NOTE — Patient Instructions (Signed)
 Drink fluids - stay hydrated   Eat bland (avoid heavy/spicy or fatty food)    Lab today  Stool test order also   If symptoms worsen or change in the meantime give us  a call

## 2024-01-16 NOTE — Telephone Encounter (Addendum)
 Pt came in for an acute appt with Dr. Randeen and  asked that I send a refill request to PCP for Vyvanse   Last filled on 10/14/23 #90 caps/ 0 refills   Last OV with PCP was on 10/20/23  [facility] [address]

## 2024-01-17 ENCOUNTER — Ambulatory Visit: Payer: Self-pay | Admitting: Family Medicine

## 2024-01-17 ENCOUNTER — Other Ambulatory Visit: Payer: Self-pay | Admitting: Radiology

## 2024-01-17 DIAGNOSIS — R197 Diarrhea, unspecified: Secondary | ICD-10-CM

## 2024-01-17 LAB — HEPATIC FUNCTION PANEL
ALT: 34 U/L (ref 0–53)
AST: 25 U/L (ref 0–37)
Albumin: 4.6 g/dL (ref 3.5–5.2)
Alkaline Phosphatase: 64 U/L (ref 39–117)
Bilirubin, Direct: 0.1 mg/dL (ref 0.0–0.3)
Total Bilirubin: 0.4 mg/dL (ref 0.2–1.2)
Total Protein: 7 g/dL (ref 6.0–8.3)

## 2024-01-17 LAB — CBC WITH DIFFERENTIAL/PLATELET
Basophils Absolute: 0 K/uL (ref 0.0–0.1)
Basophils Relative: 0.3 % (ref 0.0–3.0)
Eosinophils Absolute: 0.3 K/uL (ref 0.0–0.7)
Eosinophils Relative: 3.7 % (ref 0.0–5.0)
HCT: 43.4 % (ref 39.0–52.0)
Hemoglobin: 15 g/dL (ref 13.0–17.0)
Lymphocytes Relative: 14.4 % (ref 12.0–46.0)
Lymphs Abs: 1.3 K/uL (ref 0.7–4.0)
MCHC: 34.6 g/dL (ref 30.0–36.0)
MCV: 87.8 fl (ref 78.0–100.0)
Monocytes Absolute: 0.8 K/uL (ref 0.1–1.0)
Monocytes Relative: 9.7 % (ref 3.0–12.0)
Neutro Abs: 6.3 K/uL (ref 1.4–7.7)
Neutrophils Relative %: 71.9 % (ref 43.0–77.0)
Platelets: 335 K/uL (ref 150.0–400.0)
RBC: 4.94 Mil/uL (ref 4.22–5.81)
RDW: 12.5 % (ref 11.5–15.5)
WBC: 8.7 K/uL (ref 4.0–10.5)

## 2024-01-17 LAB — BASIC METABOLIC PANEL WITH GFR
BUN: 10 mg/dL (ref 6–23)
CO2: 27 meq/L (ref 19–32)
Calcium: 9.1 mg/dL (ref 8.4–10.5)
Chloride: 105 meq/L (ref 96–112)
Creatinine, Ser: 0.95 mg/dL (ref 0.40–1.50)
GFR: 110.9 mL/min (ref >60.00)
Glucose, Bld: 87 mg/dL (ref 70–99)
Potassium: 3.9 meq/L (ref 3.5–5.1)
Sodium: 140 meq/L (ref 135–145)

## 2024-01-17 NOTE — Addendum Note (Signed)
 Addended by: HOPE VEVA PARAS on: 01/17/2024 10:38 AM   Modules accepted: Orders

## 2024-01-18 LAB — C. DIFFICILE GDH AND TOXIN A/B
GDH ANTIGEN: NOT DETECTED
MICRO NUMBER:: 17303265
SPECIMEN QUALITY:: ADEQUATE
TOXIN A AND B: NOT DETECTED

## 2024-01-20 LAB — GASTROINTESTINAL PATHOGEN PNL
CampyloBacter Group: NOT DETECTED
Norovirus GI/GII: NOT DETECTED
Rotavirus A: NOT DETECTED
Salmonella species: NOT DETECTED
Shiga Toxin 1: NOT DETECTED
Shiga Toxin 2: NOT DETECTED
Shigella Species: NOT DETECTED
Vibrio Group: NOT DETECTED
Yersinia enterocolitica: NOT DETECTED

## 2024-02-22 ENCOUNTER — Telehealth: Payer: Self-pay | Admitting: Family Medicine

## 2024-02-22 NOTE — Telephone Encounter (Signed)
 Copied from CRM #[id number]. Topic: General - Other >> Feb 22, 2024 12:39 PM Aleatha C wrote: Reason for CRM: Patient needs Dr Cleatus to sign a medical form please give him a call for when its a good time for him to bring it call back [phone]

## 2024-02-22 NOTE — Telephone Encounter (Signed)
 Called patient left message to call office. When calls back let him know he can drop form off anytime. Dr. Cleatus will need to review and can take a few days to get back. Dr. Cleatus is out of the office this week so that will extend turn around time.    Please provide message from provider/office when call is returned from patient.

## 2024-02-24 ENCOUNTER — Telehealth: Payer: Self-pay | Admitting: Family Medicine

## 2024-02-24 NOTE — Telephone Encounter (Signed)
 Patient brought in forms to be completed by provider for .  Additional comments:[employer]   Received by: Renae    Form should be Faxed to:   Form should be mailed to:     Is patient requesting call for pickup:Yes      Form placed:  handed to CMA Amy Delores Kearns charge sheet. Y   Individual made aware of 3-5 business day turn around (Y/N)?  Y

## 2024-02-24 NOTE — Telephone Encounter (Signed)
 Spoke with pt, informed him that he can drop off form anytime and informed him that Dr Cleatus is not in the office today and that we will call him when form is ready.

## 2024-02-27 NOTE — Telephone Encounter (Signed)
 Form placed in Dr Elfredia Box on his desk

## 2024-02-27 NOTE — Telephone Encounter (Signed)
 Form done.  Please see if patient has any idea about how long he is going to be out of town, to arrange for rxs.  Thanks.

## 2024-02-28 NOTE — Telephone Encounter (Signed)
 Informed pt his forms are ready for pick up. Pt states he will be out of town from 04/13/24 to 04/22/24. Forms are in the front office.

## 2024-02-28 NOTE — Telephone Encounter (Signed)
Pt came by and picked up form
